# Patient Record
Sex: Female | Born: 1940 | Race: Black or African American | Hispanic: No | Marital: Single | Smoking: Never smoker
Health system: Southern US, Community
[De-identification: ages and names within clinical notes are randomized; demographics above are authoritative.]

## PROBLEM LIST (undated history)

## (undated) DIAGNOSIS — E119 Type 2 diabetes mellitus without complications: Secondary | ICD-10-CM

## (undated) DIAGNOSIS — I10 Essential (primary) hypertension: Secondary | ICD-10-CM

## (undated) DIAGNOSIS — E785 Hyperlipidemia, unspecified: Secondary | ICD-10-CM

## (undated) HISTORY — DX: Essential (primary) hypertension: I10

## (undated) HISTORY — DX: Hyperlipidemia, unspecified: E78.5

## (undated) HISTORY — DX: Type 2 diabetes mellitus without complications: E11.9

## (undated) HISTORY — PX: NO PAST SURGERIES: SHX2092

---

## 2001-06-27 ENCOUNTER — Emergency Department (HOSPITAL_COMMUNITY): Admission: EM | Admit: 2001-06-27 | Discharge: 2001-06-27 | Payer: Self-pay

## 2004-07-17 ENCOUNTER — Emergency Department (HOSPITAL_COMMUNITY): Admission: EM | Admit: 2004-07-17 | Discharge: 2004-07-17 | Payer: Self-pay | Admitting: Emergency Medicine

## 2004-09-06 ENCOUNTER — Emergency Department (HOSPITAL_COMMUNITY): Admission: EM | Admit: 2004-09-06 | Discharge: 2004-09-06 | Payer: Self-pay | Admitting: Family Medicine

## 2004-10-03 ENCOUNTER — Emergency Department (HOSPITAL_COMMUNITY): Admission: EM | Admit: 2004-10-03 | Discharge: 2004-10-03 | Payer: Self-pay | Admitting: Family Medicine

## 2004-11-02 ENCOUNTER — Emergency Department (HOSPITAL_COMMUNITY): Admission: EM | Admit: 2004-11-02 | Discharge: 2004-11-02 | Payer: Self-pay | Admitting: Family Medicine

## 2004-11-09 ENCOUNTER — Emergency Department (HOSPITAL_COMMUNITY): Admission: EM | Admit: 2004-11-09 | Discharge: 2004-11-09 | Payer: Self-pay | Admitting: Family Medicine

## 2011-11-23 DIAGNOSIS — E785 Hyperlipidemia, unspecified: Secondary | ICD-10-CM | POA: Diagnosis not present

## 2011-11-23 DIAGNOSIS — H524 Presbyopia: Secondary | ICD-10-CM | POA: Diagnosis not present

## 2011-11-23 DIAGNOSIS — I10 Essential (primary) hypertension: Secondary | ICD-10-CM | POA: Diagnosis not present

## 2011-11-23 DIAGNOSIS — H52229 Regular astigmatism, unspecified eye: Secondary | ICD-10-CM | POA: Diagnosis not present

## 2011-11-23 DIAGNOSIS — E119 Type 2 diabetes mellitus without complications: Secondary | ICD-10-CM | POA: Diagnosis not present

## 2011-11-23 DIAGNOSIS — H5200 Hypermetropia, unspecified eye: Secondary | ICD-10-CM | POA: Diagnosis not present

## 2011-11-23 DIAGNOSIS — J019 Acute sinusitis, unspecified: Secondary | ICD-10-CM | POA: Diagnosis not present

## 2012-03-04 DIAGNOSIS — J029 Acute pharyngitis, unspecified: Secondary | ICD-10-CM | POA: Diagnosis not present

## 2012-03-04 DIAGNOSIS — J019 Acute sinusitis, unspecified: Secondary | ICD-10-CM | POA: Diagnosis not present

## 2012-04-07 DIAGNOSIS — E119 Type 2 diabetes mellitus without complications: Secondary | ICD-10-CM | POA: Diagnosis not present

## 2012-04-07 DIAGNOSIS — I10 Essential (primary) hypertension: Secondary | ICD-10-CM | POA: Diagnosis not present

## 2012-07-10 DIAGNOSIS — E119 Type 2 diabetes mellitus without complications: Secondary | ICD-10-CM | POA: Diagnosis not present

## 2012-07-10 DIAGNOSIS — I10 Essential (primary) hypertension: Secondary | ICD-10-CM | POA: Diagnosis not present

## 2012-07-10 DIAGNOSIS — E785 Hyperlipidemia, unspecified: Secondary | ICD-10-CM | POA: Diagnosis not present

## 2012-07-16 DIAGNOSIS — J019 Acute sinusitis, unspecified: Secondary | ICD-10-CM | POA: Diagnosis not present

## 2012-10-21 DIAGNOSIS — E669 Obesity, unspecified: Secondary | ICD-10-CM | POA: Diagnosis not present

## 2012-10-21 DIAGNOSIS — E119 Type 2 diabetes mellitus without complications: Secondary | ICD-10-CM | POA: Diagnosis not present

## 2012-10-21 DIAGNOSIS — E785 Hyperlipidemia, unspecified: Secondary | ICD-10-CM | POA: Diagnosis not present

## 2012-10-21 DIAGNOSIS — I10 Essential (primary) hypertension: Secondary | ICD-10-CM | POA: Diagnosis not present

## 2012-10-23 ENCOUNTER — Other Ambulatory Visit: Payer: Self-pay | Admitting: Nurse Practitioner

## 2012-10-23 ENCOUNTER — Other Ambulatory Visit (HOSPITAL_COMMUNITY): Payer: Self-pay | Admitting: Nurse Practitioner

## 2012-10-23 DIAGNOSIS — M810 Age-related osteoporosis without current pathological fracture: Secondary | ICD-10-CM

## 2012-10-23 DIAGNOSIS — Z1231 Encounter for screening mammogram for malignant neoplasm of breast: Secondary | ICD-10-CM

## 2012-11-14 ENCOUNTER — Ambulatory Visit (HOSPITAL_COMMUNITY)
Admission: RE | Admit: 2012-11-14 | Discharge: 2012-11-14 | Disposition: A | Discharge status: Home / Self Care | Payer: BC Managed Care – PPO | Source: Ambulatory Visit | Attending: Nurse Practitioner | Admitting: Nurse Practitioner

## 2012-11-14 ENCOUNTER — Ambulatory Visit: Payer: Self-pay

## 2012-11-14 DIAGNOSIS — J209 Acute bronchitis, unspecified: Secondary | ICD-10-CM | POA: Diagnosis not present

## 2012-11-14 DIAGNOSIS — Z13820 Encounter for screening for osteoporosis: Secondary | ICD-10-CM | POA: Diagnosis not present

## 2012-11-14 DIAGNOSIS — J019 Acute sinusitis, unspecified: Secondary | ICD-10-CM | POA: Diagnosis not present

## 2012-11-14 DIAGNOSIS — Z1231 Encounter for screening mammogram for malignant neoplasm of breast: Secondary | ICD-10-CM

## 2012-11-14 DIAGNOSIS — Z780 Asymptomatic menopausal state: Secondary | ICD-10-CM | POA: Insufficient documentation

## 2012-11-14 DIAGNOSIS — M810 Age-related osteoporosis without current pathological fracture: Secondary | ICD-10-CM | POA: Diagnosis not present

## 2013-01-19 DIAGNOSIS — J040 Acute laryngitis: Secondary | ICD-10-CM | POA: Diagnosis not present

## 2013-01-20 ENCOUNTER — Encounter: Payer: Self-pay | Admitting: Gastroenterology

## 2013-02-05 DIAGNOSIS — I10 Essential (primary) hypertension: Secondary | ICD-10-CM | POA: Diagnosis not present

## 2013-02-05 DIAGNOSIS — E119 Type 2 diabetes mellitus without complications: Secondary | ICD-10-CM | POA: Diagnosis not present

## 2013-02-05 DIAGNOSIS — E785 Hyperlipidemia, unspecified: Secondary | ICD-10-CM | POA: Diagnosis not present

## 2013-02-16 ENCOUNTER — Ambulatory Visit: Payer: Non-veteran care | Admitting: Gastroenterology

## 2013-05-18 DIAGNOSIS — E785 Hyperlipidemia, unspecified: Secondary | ICD-10-CM | POA: Diagnosis not present

## 2013-05-18 DIAGNOSIS — E669 Obesity, unspecified: Secondary | ICD-10-CM | POA: Diagnosis not present

## 2013-05-18 DIAGNOSIS — E119 Type 2 diabetes mellitus without complications: Secondary | ICD-10-CM | POA: Diagnosis not present

## 2013-05-18 DIAGNOSIS — I10 Essential (primary) hypertension: Secondary | ICD-10-CM | POA: Diagnosis not present

## 2013-08-15 DIAGNOSIS — Z23 Encounter for immunization: Secondary | ICD-10-CM | POA: Diagnosis not present

## 2013-09-01 DIAGNOSIS — I10 Essential (primary) hypertension: Secondary | ICD-10-CM | POA: Diagnosis not present

## 2013-09-01 DIAGNOSIS — E785 Hyperlipidemia, unspecified: Secondary | ICD-10-CM | POA: Diagnosis not present

## 2013-09-01 DIAGNOSIS — E119 Type 2 diabetes mellitus without complications: Secondary | ICD-10-CM | POA: Diagnosis not present

## 2013-09-01 DIAGNOSIS — E669 Obesity, unspecified: Secondary | ICD-10-CM | POA: Diagnosis not present

## 2013-10-07 DIAGNOSIS — J329 Chronic sinusitis, unspecified: Secondary | ICD-10-CM | POA: Diagnosis not present

## 2013-10-07 DIAGNOSIS — J40 Bronchitis, not specified as acute or chronic: Secondary | ICD-10-CM | POA: Diagnosis not present

## 2013-11-25 DIAGNOSIS — D649 Anemia, unspecified: Secondary | ICD-10-CM | POA: Diagnosis not present

## 2013-11-25 DIAGNOSIS — J40 Bronchitis, not specified as acute or chronic: Secondary | ICD-10-CM | POA: Diagnosis not present

## 2013-11-30 DIAGNOSIS — M999 Biomechanical lesion, unspecified: Secondary | ICD-10-CM | POA: Diagnosis not present

## 2013-11-30 DIAGNOSIS — IMO0002 Reserved for concepts with insufficient information to code with codable children: Secondary | ICD-10-CM | POA: Diagnosis not present

## 2013-12-07 DIAGNOSIS — J180 Bronchopneumonia, unspecified organism: Secondary | ICD-10-CM | POA: Diagnosis not present

## 2014-01-05 ENCOUNTER — Telehealth: Payer: Self-pay | Admitting: Pulmonary Disease

## 2014-01-05 ENCOUNTER — Encounter: Payer: Self-pay | Admitting: Pulmonary Disease

## 2014-01-05 ENCOUNTER — Ambulatory Visit (INDEPENDENT_AMBULATORY_CARE_PROVIDER_SITE_OTHER)
Admission: RE | Admit: 2014-01-05 | Discharge: 2014-01-05 | Disposition: A | Discharge status: Home / Self Care | Payer: BC Managed Care – PPO | Source: Ambulatory Visit | Attending: Pulmonary Disease | Admitting: Pulmonary Disease

## 2014-01-05 ENCOUNTER — Ambulatory Visit (INDEPENDENT_AMBULATORY_CARE_PROVIDER_SITE_OTHER): Payer: BC Managed Care – PPO | Admitting: Pulmonary Disease

## 2014-01-05 VITALS — BP 140/82 | HR 69 | Temp 97.4°F | Ht 61.5 in | Wt 168.6 lb

## 2014-01-05 DIAGNOSIS — R059 Cough, unspecified: Secondary | ICD-10-CM | POA: Diagnosis not present

## 2014-01-05 DIAGNOSIS — R05 Cough: Secondary | ICD-10-CM

## 2014-01-05 NOTE — Assessment & Plan Note (Signed)
The patient has a history of recurrent URI/bronchitis, but it is unclear how much of this is related to her upper airway rather than lower airway. She has chronic hoarseness and throat clearing, and also describes classic upper airway pseudo wheezing.  This may be all related to her ACE inhibitor, and I have suggested that she comes off of this medication as quickly as possible. She also works around fifth graders, which puts her at risk for recurrent viral infections as well. She has never smoked, and has no history of asthma. At this point, I would like to check a chest x-ray to make sure she does not have underlying pulmonary pathology, and will also do pulmonary function studies to see if she has obstructive airways disease. If all of these are unremarkable, I would like to see how she does off the ACE inhibitor, and treating any postnasal drip. The main goal at this time is to determine whether she has any underlying lung disease that may be contributing to her symptoms.

## 2014-01-05 NOTE — Progress Notes (Signed)
   Subjective:    Patient ID: Jaclyn Chandler, female    DOB: [birth year], 73 y.o.   MRN: [id number]  HPI The patient is a 73 year old female who I've been asked to see for recurrent episodes of "bronchitis". The patient states that she will get sick about 2-3 times a year, and this is usually characterized by cough with discolored mucus, some chest congestion, as well as occasional shortness of breath. She will typically be treated with an antibiotic, and will return to baseline quickly. In between episodes, she has no pulmonary symptoms. She has no history of asthma, and has never had pulmonary function studies. She has not had a recent chest x-ray. 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 She also has chronic hoarseness, and describes upper airway wheezing. She is clearing her throat frequently during our visit today. She denies any recurrent sinusitis, but does have postnasal drip. She only has rare reflux symptoms. It should be noted that she is on an ACE inhibitor.   Review of Systems  Constitutional: Negative for fever and unexpected weight change.  HENT: Positive for postnasal drip, sneezing and voice change. Negative for congestion, dental problem, ear pain, nosebleeds, rhinorrhea, sinus pressure, sore throat and trouble swallowing.   Eyes: Negative for redness and itching.  Respiratory: Positive for cough. Negative for chest tightness, shortness of breath and wheezing.   Cardiovascular: Negative for palpitations and leg swelling.  Gastrointestinal: Negative for nausea and vomiting.  Genitourinary: Negative for dysuria.  Musculoskeletal: Negative for joint swelling.  Skin: Negative for rash.  Neurological: Negative for headaches.  Hematological: Does not bruise/bleed easily.  Psychiatric/Behavioral: Negative for dysphoric mood. The patient is not nervous/anxious.        Objective:   Physical Exam Constitutional:  Well  developed, no acute distress  HENT:  Nares patent without discharge  Oropharynx without exudate, palate and uvula are normal  Eyes:  Perrla, eomi, no scleral icterus  Neck:  No JVD, no TMG  Cardiovascular:  Normal rate, regular rhythm, no rubs or gallops.  No murmurs        Intact distal pulses  Pulmonary :  Normal breath sounds, no stridor or respiratory distress   No rales, rhonchi, or wheezing.  +hoarseness with mild upper airway noise.   Abdominal:  Soft, nondistended, bowel sounds present.  No tenderness noted.   Musculoskeletal:  mild lower extremity edema noted.  Lymph Nodes:  No cervical lymphadenopathy noted  Skin:  No cyanosis noted  Neurologic:  Alert, appropriate, moves all 4 extremities without obvious deficit.         Assessment & Plan:

## 2014-01-05 NOTE — Patient Instructions (Addendum)
Please talk with your primary about coming off lisinopril ASAP. Take chlorpheniramine 4mg  OTC, one every night at bedtime if you are having issues with postnasal drip. Will check a chest xray today, and will call you with results.  Will schedule for breathing tests in next 2-3 weeks to evaluate for possible asthma, and will see you back the same day to discuss.  Hopefully, you will be off the lisinopril for a period of time prior to this.

## 2014-01-05 NOTE — Telephone Encounter (Signed)
lmomtcb x1 for pt 

## 2014-01-06 NOTE — Telephone Encounter (Signed)
Result Notes    Notes Recorded by Barbaraann ShareKeith M Clance, MD on 01/05/2014 at 5:47 PM Please let pt know that her cxr is normal   I spoke with patient about results and she verbalized understanding and had no questions

## 2014-01-21 ENCOUNTER — Telehealth: Payer: Self-pay | Admitting: Pulmonary Disease

## 2014-01-21 NOTE — Telephone Encounter (Signed)
Pt is aware that the medication [facility] wanted her to take is OTC. She states that she will go to the drugstore tonight to pick this up.

## 2014-02-09 ENCOUNTER — Ambulatory Visit: Payer: BC Managed Care – PPO | Admitting: Pulmonary Disease

## 2014-02-16 DIAGNOSIS — I10 Essential (primary) hypertension: Secondary | ICD-10-CM | POA: Diagnosis not present

## 2014-02-16 DIAGNOSIS — E119 Type 2 diabetes mellitus without complications: Secondary | ICD-10-CM | POA: Diagnosis not present

## 2014-02-16 DIAGNOSIS — E785 Hyperlipidemia, unspecified: Secondary | ICD-10-CM | POA: Diagnosis not present

## 2014-03-17 ENCOUNTER — Ambulatory Visit: Payer: BC Managed Care – PPO | Admitting: Pulmonary Disease

## 2014-05-10 DIAGNOSIS — E785 Hyperlipidemia, unspecified: Secondary | ICD-10-CM | POA: Diagnosis not present

## 2014-05-10 DIAGNOSIS — H2510 Age-related nuclear cataract, unspecified eye: Secondary | ICD-10-CM | POA: Diagnosis not present

## 2014-05-10 DIAGNOSIS — E119 Type 2 diabetes mellitus without complications: Secondary | ICD-10-CM | POA: Diagnosis not present

## 2014-05-10 DIAGNOSIS — Z6831 Body mass index (BMI) 31.0-31.9, adult: Secondary | ICD-10-CM | POA: Diagnosis not present

## 2014-05-10 DIAGNOSIS — I10 Essential (primary) hypertension: Secondary | ICD-10-CM | POA: Diagnosis not present

## 2014-09-01 DIAGNOSIS — Z23 Encounter for immunization: Secondary | ICD-10-CM | POA: Diagnosis not present

## 2014-09-15 DIAGNOSIS — I10 Essential (primary) hypertension: Secondary | ICD-10-CM | POA: Diagnosis not present

## 2014-09-15 DIAGNOSIS — E1129 Type 2 diabetes mellitus with other diabetic kidney complication: Secondary | ICD-10-CM | POA: Diagnosis not present

## 2014-09-15 DIAGNOSIS — E785 Hyperlipidemia, unspecified: Secondary | ICD-10-CM | POA: Diagnosis not present

## 2014-09-15 DIAGNOSIS — Z6833 Body mass index (BMI) 33.0-33.9, adult: Secondary | ICD-10-CM | POA: Diagnosis not present

## 2014-09-15 DIAGNOSIS — N183 Chronic kidney disease, stage 3 (moderate): Secondary | ICD-10-CM | POA: Diagnosis not present

## 2014-10-20 DIAGNOSIS — I809 Phlebitis and thrombophlebitis of unspecified site: Secondary | ICD-10-CM | POA: Diagnosis not present

## 2014-10-20 DIAGNOSIS — Z6833 Body mass index (BMI) 33.0-33.9, adult: Secondary | ICD-10-CM | POA: Diagnosis not present

## 2015-01-20 ENCOUNTER — Other Ambulatory Visit (HOSPITAL_COMMUNITY): Payer: Self-pay | Admitting: Legal Medicine

## 2015-01-20 ENCOUNTER — Other Ambulatory Visit: Payer: Self-pay

## 2015-01-20 DIAGNOSIS — I10 Essential (primary) hypertension: Secondary | ICD-10-CM | POA: Diagnosis not present

## 2015-01-20 DIAGNOSIS — M810 Age-related osteoporosis without current pathological fracture: Secondary | ICD-10-CM | POA: Diagnosis not present

## 2015-01-20 DIAGNOSIS — E785 Hyperlipidemia, unspecified: Secondary | ICD-10-CM | POA: Diagnosis not present

## 2015-01-20 DIAGNOSIS — Z1231 Encounter for screening mammogram for malignant neoplasm of breast: Secondary | ICD-10-CM

## 2015-01-20 DIAGNOSIS — Z6833 Body mass index (BMI) 33.0-33.9, adult: Secondary | ICD-10-CM | POA: Diagnosis not present

## 2015-01-20 DIAGNOSIS — E119 Type 2 diabetes mellitus without complications: Secondary | ICD-10-CM | POA: Diagnosis not present

## 2015-01-21 ENCOUNTER — Ambulatory Visit: Payer: Non-veteran care

## 2015-01-21 ENCOUNTER — Other Ambulatory Visit (HOSPITAL_COMMUNITY): Payer: Self-pay | Admitting: Legal Medicine

## 2015-01-21 ENCOUNTER — Encounter (INDEPENDENT_AMBULATORY_CARE_PROVIDER_SITE_OTHER): Payer: Self-pay

## 2015-01-21 ENCOUNTER — Ambulatory Visit (HOSPITAL_COMMUNITY)
Admission: RE | Admit: 2015-01-21 | Discharge: 2015-01-21 | Disposition: A | Discharge status: Home / Self Care | Payer: BC Managed Care – PPO | Source: Ambulatory Visit | Attending: Legal Medicine | Admitting: Legal Medicine

## 2015-01-21 DIAGNOSIS — Z13820 Encounter for screening for osteoporosis: Secondary | ICD-10-CM

## 2015-01-21 DIAGNOSIS — Z1231 Encounter for screening mammogram for malignant neoplasm of breast: Secondary | ICD-10-CM | POA: Diagnosis not present

## 2015-01-21 DIAGNOSIS — M8580 Other specified disorders of bone density and structure, unspecified site: Secondary | ICD-10-CM

## 2015-01-21 DIAGNOSIS — Z780 Asymptomatic menopausal state: Secondary | ICD-10-CM | POA: Diagnosis not present

## 2015-01-21 DIAGNOSIS — M810 Age-related osteoporosis without current pathological fracture: Secondary | ICD-10-CM | POA: Diagnosis not present

## 2015-01-24 DIAGNOSIS — Z23 Encounter for immunization: Secondary | ICD-10-CM | POA: Diagnosis not present

## 2015-07-27 DIAGNOSIS — Z6835 Body mass index (BMI) 35.0-35.9, adult: Secondary | ICD-10-CM | POA: Diagnosis not present

## 2015-07-27 DIAGNOSIS — I10 Essential (primary) hypertension: Secondary | ICD-10-CM | POA: Diagnosis not present

## 2015-07-27 DIAGNOSIS — E119 Type 2 diabetes mellitus without complications: Secondary | ICD-10-CM | POA: Diagnosis not present

## 2016-01-25 DIAGNOSIS — I10 Essential (primary) hypertension: Secondary | ICD-10-CM | POA: Diagnosis not present

## 2016-01-25 DIAGNOSIS — Z6835 Body mass index (BMI) 35.0-35.9, adult: Secondary | ICD-10-CM | POA: Diagnosis not present

## 2016-01-25 DIAGNOSIS — E1129 Type 2 diabetes mellitus with other diabetic kidney complication: Secondary | ICD-10-CM | POA: Diagnosis not present

## 2016-01-25 DIAGNOSIS — N183 Chronic kidney disease, stage 3 (moderate): Secondary | ICD-10-CM | POA: Diagnosis not present

## 2016-01-25 DIAGNOSIS — E785 Hyperlipidemia, unspecified: Secondary | ICD-10-CM | POA: Diagnosis not present

## 2016-02-25 DIAGNOSIS — J019 Acute sinusitis, unspecified: Secondary | ICD-10-CM | POA: Diagnosis not present

## 2016-02-25 DIAGNOSIS — Z6834 Body mass index (BMI) 34.0-34.9, adult: Secondary | ICD-10-CM | POA: Diagnosis not present

## 2016-09-12 DIAGNOSIS — H25811 Combined forms of age-related cataract, right eye: Secondary | ICD-10-CM | POA: Diagnosis not present

## 2016-09-12 DIAGNOSIS — H25812 Combined forms of age-related cataract, left eye: Secondary | ICD-10-CM | POA: Diagnosis not present

## 2016-10-11 DIAGNOSIS — H2512 Age-related nuclear cataract, left eye: Secondary | ICD-10-CM | POA: Diagnosis not present

## 2016-10-11 DIAGNOSIS — H25812 Combined forms of age-related cataract, left eye: Secondary | ICD-10-CM | POA: Diagnosis not present

## 2017-01-03 DIAGNOSIS — I10 Essential (primary) hypertension: Secondary | ICD-10-CM | POA: Diagnosis not present

## 2017-01-03 DIAGNOSIS — R0981 Nasal congestion: Secondary | ICD-10-CM | POA: Diagnosis not present

## 2017-01-03 DIAGNOSIS — R05 Cough: Secondary | ICD-10-CM | POA: Diagnosis not present

## 2017-01-03 DIAGNOSIS — J019 Acute sinusitis, unspecified: Secondary | ICD-10-CM | POA: Diagnosis not present

## 2017-02-02 DIAGNOSIS — Z6833 Body mass index (BMI) 33.0-33.9, adult: Secondary | ICD-10-CM | POA: Diagnosis not present

## 2017-02-02 DIAGNOSIS — R21 Rash and other nonspecific skin eruption: Secondary | ICD-10-CM | POA: Diagnosis not present

## 2017-10-16 ENCOUNTER — Other Ambulatory Visit: Payer: Self-pay | Admitting: Nurse Practitioner

## 2017-10-16 DIAGNOSIS — Z1231 Encounter for screening mammogram for malignant neoplasm of breast: Secondary | ICD-10-CM

## 2017-10-18 ENCOUNTER — Ambulatory Visit
Admission: RE | Admit: 2017-10-18 | Discharge: 2017-10-18 | Disposition: A | Discharge status: Home / Self Care | Payer: Medicare Other | Source: Ambulatory Visit | Attending: Nurse Practitioner | Admitting: Nurse Practitioner

## 2017-10-18 ENCOUNTER — Ambulatory Visit: Payer: Non-veteran care

## 2017-10-18 DIAGNOSIS — Z1231 Encounter for screening mammogram for malignant neoplasm of breast: Secondary | ICD-10-CM

## 2017-11-05 ENCOUNTER — Other Ambulatory Visit: Payer: Self-pay | Admitting: Nurse Practitioner

## 2017-11-05 DIAGNOSIS — M810 Age-related osteoporosis without current pathological fracture: Secondary | ICD-10-CM

## 2018-01-10 ENCOUNTER — Ambulatory Visit
Admission: RE | Admit: 2018-01-10 | Discharge: 2018-01-10 | Disposition: A | Discharge status: Home / Self Care | Payer: Medicare Other | Source: Ambulatory Visit | Attending: Nurse Practitioner | Admitting: Nurse Practitioner

## 2018-01-10 DIAGNOSIS — M810 Age-related osteoporosis without current pathological fracture: Secondary | ICD-10-CM

## 2019-06-04 ENCOUNTER — Other Ambulatory Visit: Payer: Self-pay | Admitting: Nurse Practitioner

## 2019-06-04 DIAGNOSIS — Z1231 Encounter for screening mammogram for malignant neoplasm of breast: Secondary | ICD-10-CM

## 2019-07-21 ENCOUNTER — Ambulatory Visit
Admission: RE | Admit: 2019-07-21 | Discharge: 2019-07-21 | Disposition: A | Discharge status: Home / Self Care | Payer: Medicare PPO | Source: Ambulatory Visit | Attending: Nurse Practitioner | Admitting: Nurse Practitioner

## 2019-07-21 ENCOUNTER — Other Ambulatory Visit: Payer: Self-pay

## 2019-07-21 DIAGNOSIS — Z1231 Encounter for screening mammogram for malignant neoplasm of breast: Secondary | ICD-10-CM

## 2019-11-11 ENCOUNTER — Other Ambulatory Visit: Payer: Self-pay | Admitting: Nurse Practitioner

## 2019-11-11 DIAGNOSIS — M8589 Other specified disorders of bone density and structure, multiple sites: Secondary | ICD-10-CM

## 2020-01-05 ENCOUNTER — Encounter: Payer: Self-pay | Admitting: Gastroenterology

## 2020-01-12 ENCOUNTER — Other Ambulatory Visit: Payer: Medicare PPO

## 2020-01-21 ENCOUNTER — Telehealth: Payer: Self-pay | Admitting: *Deleted

## 2020-01-21 NOTE — Telephone Encounter (Signed)
noted 

## 2020-01-21 NOTE — Telephone Encounter (Signed)
Dr. Adela Lank,  This pt is coming in for a PV on 01-28-20 and her colonoscopy is 02-10-20.  From what I can tell, she has never had a colonoscopy before and she is 7.  Per our PV protocol, we are to check with the MD prior to their procedure if they are greater than 77 years old and it a direct/screening colonoscopy to see if you want to proceed.  Please advise.  Thanks, [employer]

## 2020-01-21 NOTE — Telephone Encounter (Signed)
Thanks [employer]. If she has never had a prior colonoscopy guidelines recommend a first time exam at this age and if she is otherwise without any significant comorbidities that would warrant her case being done at the hospital, I think okay to proceed with direct book colonoscopy at the [facility]. Thanks

## 2020-02-10 ENCOUNTER — Encounter: Payer: Medicare PPO | Admitting: Gastroenterology

## 2020-06-21 ENCOUNTER — Other Ambulatory Visit: Payer: Self-pay | Admitting: Nurse Practitioner

## 2020-06-21 DIAGNOSIS — Z1231 Encounter for screening mammogram for malignant neoplasm of breast: Secondary | ICD-10-CM

## 2020-07-25 ENCOUNTER — Ambulatory Visit: Payer: Medicare Other

## 2020-08-15 ENCOUNTER — Other Ambulatory Visit: Payer: Self-pay

## 2020-08-15 ENCOUNTER — Ambulatory Visit
Admission: RE | Admit: 2020-08-15 | Discharge: 2020-08-15 | Disposition: A | Discharge status: Home / Self Care | Payer: Medicare Other | Source: Ambulatory Visit | Attending: Nurse Practitioner | Admitting: Nurse Practitioner

## 2020-08-15 DIAGNOSIS — Z1231 Encounter for screening mammogram for malignant neoplasm of breast: Secondary | ICD-10-CM

## 2021-02-03 ENCOUNTER — Other Ambulatory Visit: Payer: Self-pay | Admitting: Physician Assistant

## 2021-02-03 DIAGNOSIS — M810 Age-related osteoporosis without current pathological fracture: Secondary | ICD-10-CM

## 2021-08-18 ENCOUNTER — Encounter: Payer: Self-pay | Admitting: Physician Assistant

## 2021-09-12 ENCOUNTER — Other Ambulatory Visit: Payer: Self-pay | Admitting: *Deleted

## 2021-09-12 ENCOUNTER — Other Ambulatory Visit: Payer: Self-pay | Admitting: Cardiology

## 2021-09-12 DIAGNOSIS — Z1231 Encounter for screening mammogram for malignant neoplasm of breast: Secondary | ICD-10-CM

## 2021-09-13 ENCOUNTER — Ambulatory Visit: Payer: Medicare Other | Admitting: Physician Assistant

## 2021-10-18 ENCOUNTER — Ambulatory Visit
Admission: RE | Admit: 2021-10-18 | Discharge: 2021-10-18 | Disposition: A | Discharge status: Home / Self Care | Payer: Medicare HMO | Source: Ambulatory Visit | Attending: Physician Assistant | Admitting: Physician Assistant

## 2021-10-18 ENCOUNTER — Ambulatory Visit: Payer: Medicare HMO

## 2021-10-18 DIAGNOSIS — Z1231 Encounter for screening mammogram for malignant neoplasm of breast: Secondary | ICD-10-CM

## 2021-10-30 ENCOUNTER — Telehealth: Payer: Self-pay | Admitting: Gastroenterology

## 2021-10-30 ENCOUNTER — Ambulatory Visit: Payer: Medicare HMO | Admitting: Gastroenterology

## 2021-10-30 NOTE — Progress Notes (Deleted)
[facility] Consult Note:  History: Jaclyn Chandler 10/30/2021  Referring provider: Abigail Miyamoto, MD  Reason for consult/chief complaint: No chief complaint on file.   Subjective  HPI:  ***   ROS:  Review of Systems   Past Medical History: Past Medical History:  Diagnosis Date   Diabetes mellitus, type 2 (HCC)    HTN (hypertension)    Hyperlipidemia      Past Surgical History: Past Surgical History:  Procedure Laterality Date   NO PAST SURGERIES       Family History: Family History  Problem Relation Age of Onset   Heart disease Mother    Hypertension Mother    Rheum arthritis Paternal Aunt    Hypertension Sister        multiple   Lung disease Brother    Hypertension Brother        multiple   Breast cancer Neg Hx     Social History: Social History   Socioeconomic History   Marital status: Single    Spouse name: Not on file   Number of children: Not on file   Years of education: Not on file   Highest education level: Not on file  Occupational History   Occupation: Data processing manager    Employer: [employer]  Tobacco Use   Smoking status: Never   Smokeless tobacco: Not on file  Substance and Sexual Activity   Alcohol use: No   Drug use: No   Sexual activity: Not on file  Other Topics Concern   Not on file  Social History Narrative   Not on file   Social Determinants of Health   Financial Resource Strain: Not on file  Food Insecurity: Not on file  Transportation Needs: Not on file  Physical Activity: Not on file  Stress: Not on file  Social Connections: Not on file    Allergies: Allergies  Allergen Reactions   Penicillins     Outpatient Meds: Current Outpatient Medications  Medication Sig Dispense Refill   albuterol (PROVENTIL HFA;VENTOLIN HFA) 108 (90 BASE) MCG/ACT inhaler Inhale 1-2 puffs into the lungs every 6 (six) hours as needed for wheezing or shortness of breath.      aspirin 81 MG tablet Take 81 mg by mouth daily.     atenolol (TENORMIN) 50 MG tablet Take 50 mg by mouth daily.     Calcium Carbonate-Vitamin D (SM CALCIUM 500/VITAMIN D3 PO) Take 1 capsule by mouth daily.     COD LIVER OIL PO Take 415 mg by mouth daily.     Dextromethorphan-Guaifenesin 20-400 MG TABS Take by mouth.     Dextromethorphan-Guaifenesin 20-400 MG/5ML SYRP Take by mouth as needed.     Garlic 1000 MG CAPS Take 1 capsule by mouth daily.     guaiFENesin (MUCINEX) 600 MG 12 hr tablet Take 600 mg by mouth as needed.     lisinopril-hydrochlorothiazide (PRINZIDE,ZESTORETIC) 20-12.5 MG per tablet Take 1 tablet by mouth 2 (two) times daily.     metFORMIN (GLUCOPHAGE) 500 MG tablet Take 500 mg by mouth 2 (two) times daily with a meal.     Multiple Vitamin (ONE-DAILY MULTI VITAMINS PO) Take 1 tablet by mouth daily.     Omega-3 Fatty Acids (FISH OIL) 1000 MG CAPS Take 1 capsule by mouth daily.     simvastatin (ZOCOR) 40 MG tablet Take 40 mg by mouth daily.     Vit C-Cholecalciferol-Rose Hip (VITAMIN C & D3/ROSE HIPS) [phone] MG-UNIT-MG CAPS Take 1  tablet by mouth daily.     No current facility-administered medications for this visit.      ___________________________________________________________________ Objective   Exam:  There were no vitals taken for this visit. Wt Readings from Last 3 Encounters:  01/05/14 168 lb 9.6 oz (76.5 kg)    General: ***  Eyes: sclera anicteric, no redness ENT: oral mucosa moist without lesions, no cervical or supraclavicular lymphadenopathy CV: RRR without murmur, S1/S2, no JVD, no peripheral edema Resp: clear to auscultation bilaterally, normal RR and effort noted GI: soft, *** tenderness, with active bowel sounds. No guarding or palpable organomegaly noted. Skin; warm and dry, no rash or jaundice noted Neuro: awake, alert and oriented x 3. Normal gross motor function and fluent speech  Labs:  ***  Radiologic  Studies:  ***  Assessment: No diagnosis found.  ***  Plan:  ***  Thank you for the courtesy of this consult.  Please call me with any questions or concerns.  Charlie Pitter III  CC: Referring provider noted above

## 2021-10-30 NOTE — Telephone Encounter (Signed)
Good Afternoon Dr. Myrtie Neither,  Patient called to cancel appointment with you today at 3:20 due to not feeling well.   Patient stated that she would call back when she is feeling better to reschedule.

## 2021-12-07 ENCOUNTER — Other Ambulatory Visit: Payer: Self-pay | Admitting: Physician Assistant

## 2021-12-07 DIAGNOSIS — M810 Age-related osteoporosis without current pathological fracture: Secondary | ICD-10-CM

## 2022-01-08 ENCOUNTER — Other Ambulatory Visit: Payer: Self-pay | Admitting: Legal Medicine

## 2022-01-08 ENCOUNTER — Other Ambulatory Visit: Payer: Self-pay | Admitting: Cardiology

## 2022-05-07 ENCOUNTER — Other Ambulatory Visit: Payer: BC Managed Care – PPO

## 2022-09-25 ENCOUNTER — Other Ambulatory Visit: Payer: Self-pay | Admitting: Physician Assistant

## 2022-09-25 DIAGNOSIS — M810 Age-related osteoporosis without current pathological fracture: Secondary | ICD-10-CM

## 2022-09-25 DIAGNOSIS — Z1231 Encounter for screening mammogram for malignant neoplasm of breast: Secondary | ICD-10-CM

## 2022-11-15 ENCOUNTER — Other Ambulatory Visit: Payer: BC Managed Care – PPO

## 2022-12-20 ENCOUNTER — Ambulatory Visit
Admission: RE | Admit: 2022-12-20 | Discharge: 2022-12-20 | Disposition: A | Discharge status: Home / Self Care | Payer: Medicare HMO | Source: Ambulatory Visit | Attending: Physician Assistant | Admitting: Physician Assistant

## 2022-12-20 ENCOUNTER — Other Ambulatory Visit: Payer: BC Managed Care – PPO

## 2022-12-20 DIAGNOSIS — Z1231 Encounter for screening mammogram for malignant neoplasm of breast: Secondary | ICD-10-CM

## 2023-02-06 ENCOUNTER — Ambulatory Visit
Admission: RE | Admit: 2023-02-06 | Discharge: 2023-02-06 | Disposition: A | Discharge status: Home / Self Care | Payer: Medicare HMO | Source: Ambulatory Visit | Attending: Physician Assistant | Admitting: Physician Assistant

## 2023-02-06 ENCOUNTER — Other Ambulatory Visit: Payer: Self-pay

## 2023-02-06 DIAGNOSIS — R7989 Other specified abnormal findings of blood chemistry: Secondary | ICD-10-CM

## 2023-05-23 ENCOUNTER — Other Ambulatory Visit: Payer: Self-pay | Admitting: Physician Assistant

## 2023-05-23 DIAGNOSIS — M810 Age-related osteoporosis without current pathological fracture: Secondary | ICD-10-CM

## 2023-05-28 ENCOUNTER — Other Ambulatory Visit: Payer: BC Managed Care – PPO

## 2023-05-29 DIAGNOSIS — H2511 Age-related nuclear cataract, right eye: Secondary | ICD-10-CM | POA: Diagnosis not present

## 2023-07-18 DIAGNOSIS — I10 Essential (primary) hypertension: Secondary | ICD-10-CM | POA: Diagnosis not present

## 2023-07-18 DIAGNOSIS — E1165 Type 2 diabetes mellitus with hyperglycemia: Secondary | ICD-10-CM | POA: Diagnosis not present

## 2023-07-22 DIAGNOSIS — Z23 Encounter for immunization: Secondary | ICD-10-CM | POA: Diagnosis not present

## 2023-08-14 IMAGING — MG MM DIGITAL SCREENING BILAT W/ TOMO AND CAD
8 series · 8 of 24 positions shown · non-contrast
Comparison: Previous exam(s).

CLINICAL DATA: Screening.

EXAM:
DIGITAL SCREENING BILATERAL MAMMOGRAM WITH TOMOSYNTHESIS AND CAD
TECHNIQUE: Bilateral screening digital craniocaudal and mediolateral oblique
mammograms were obtained. Bilateral screening digital breast
tomosynthesis was performed. The images were evaluated with
computer-aided detection.

[L CC synth-2D]
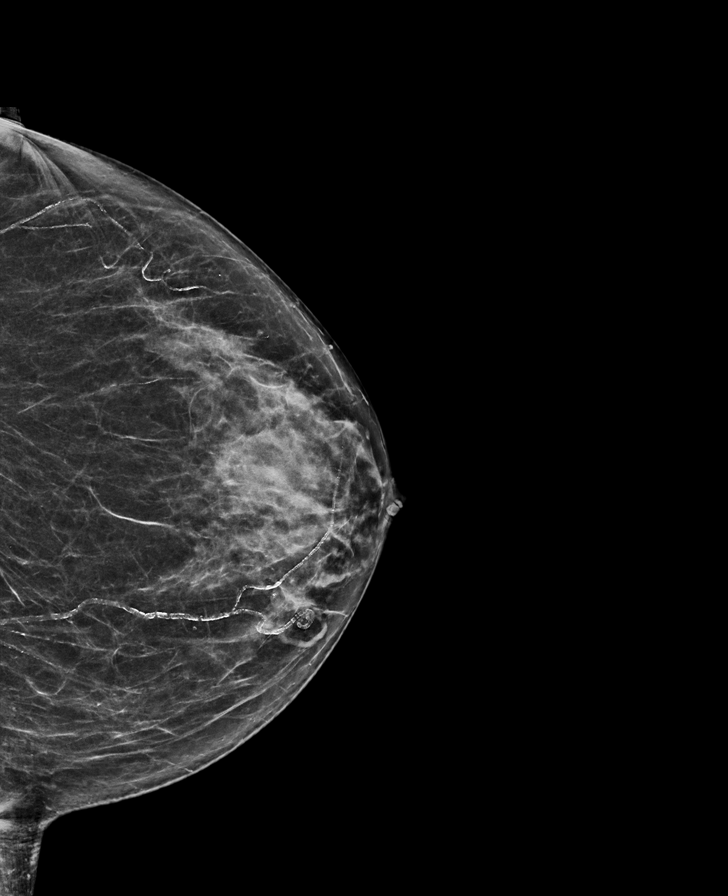

[L MLO synth-2D]
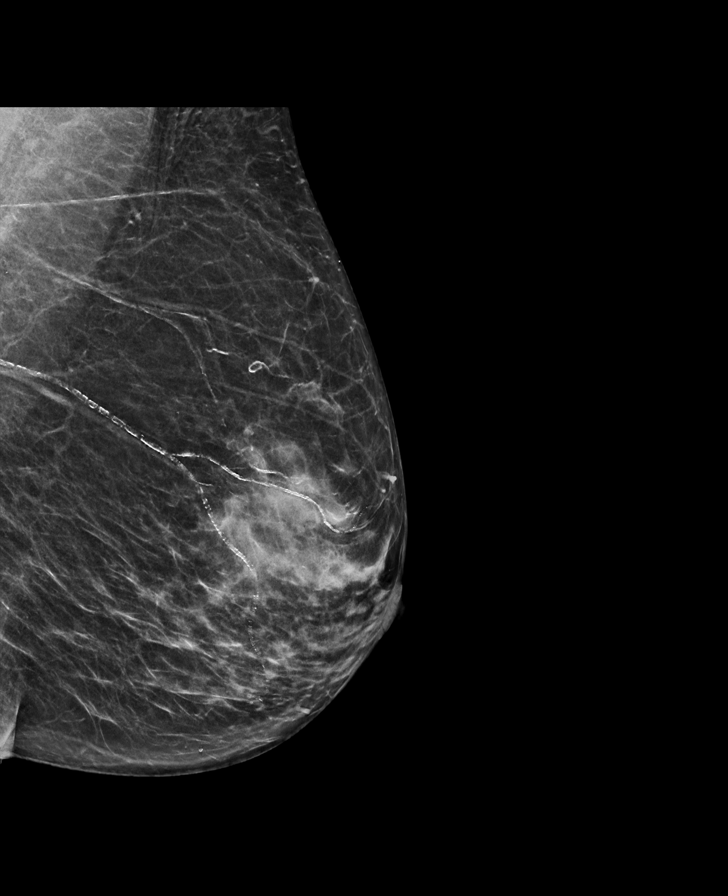

[R CC synth-2D]
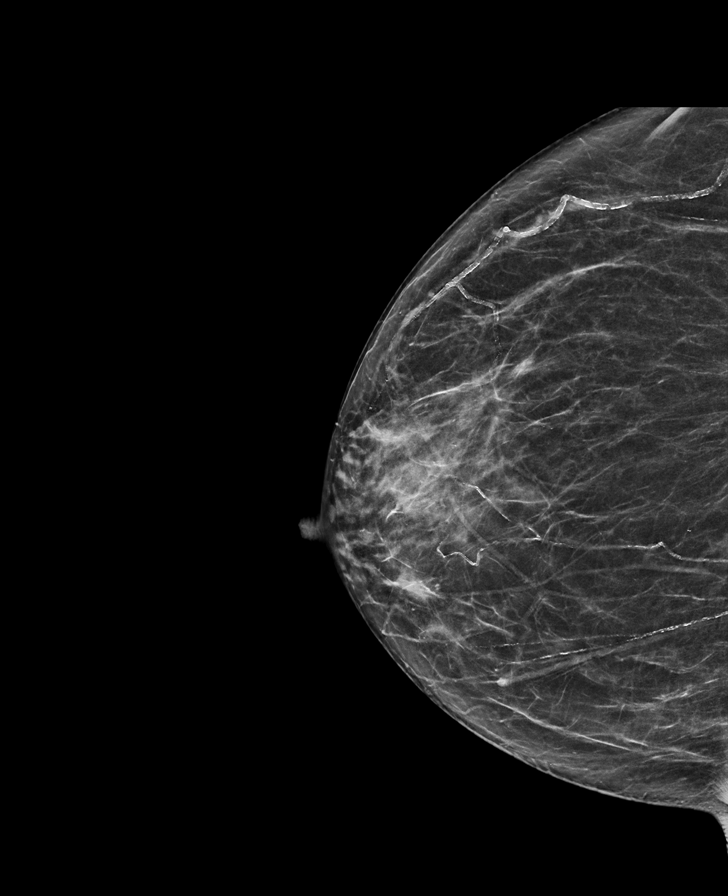

[R MLO synth-2D]
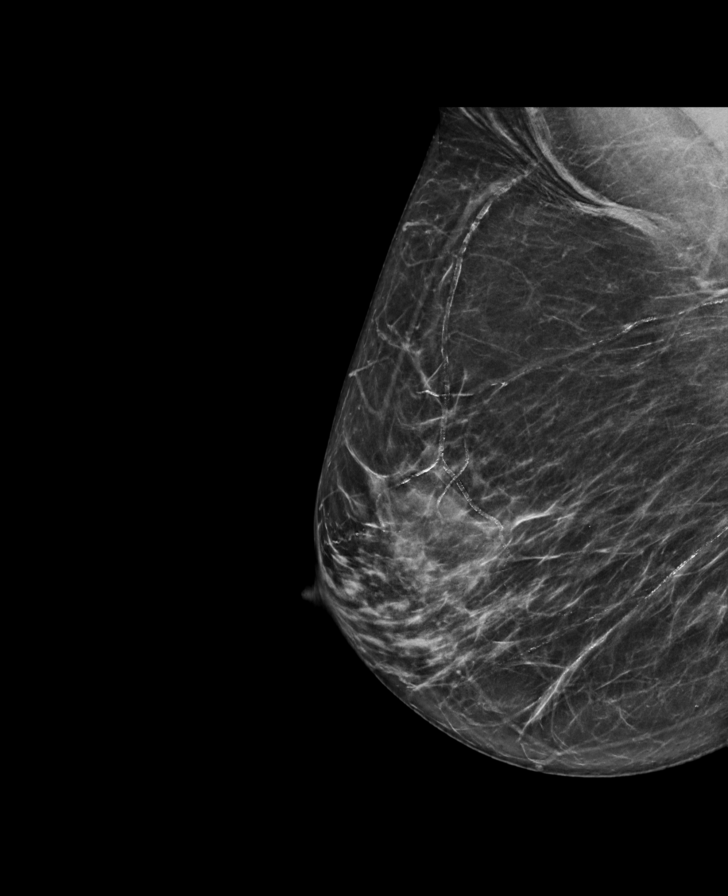

[R MLO tomo · tomo slice 35/70.0]
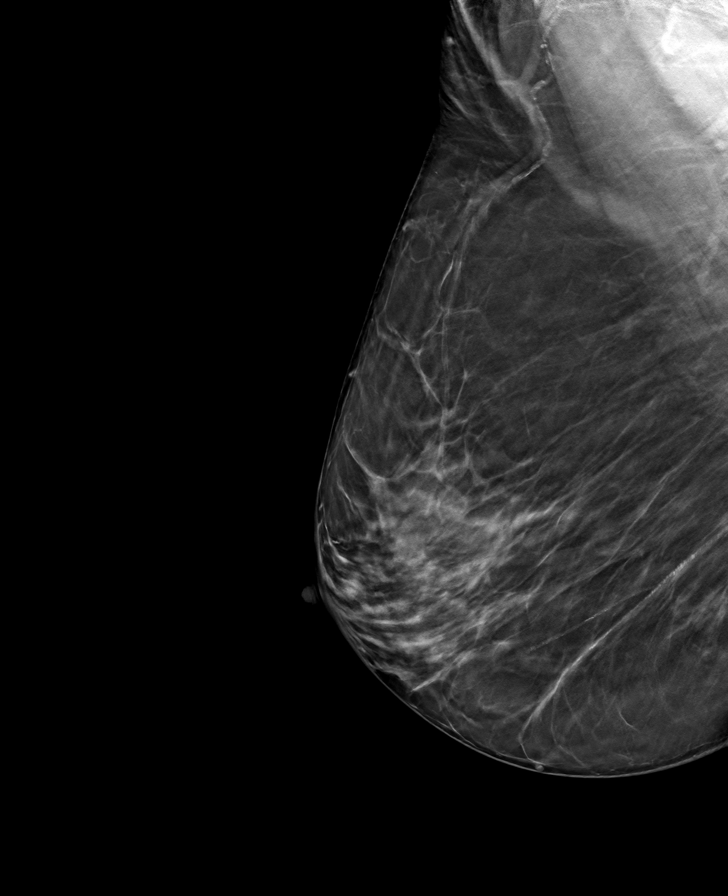

[R CC tomo · tomo slice 32/63.0]
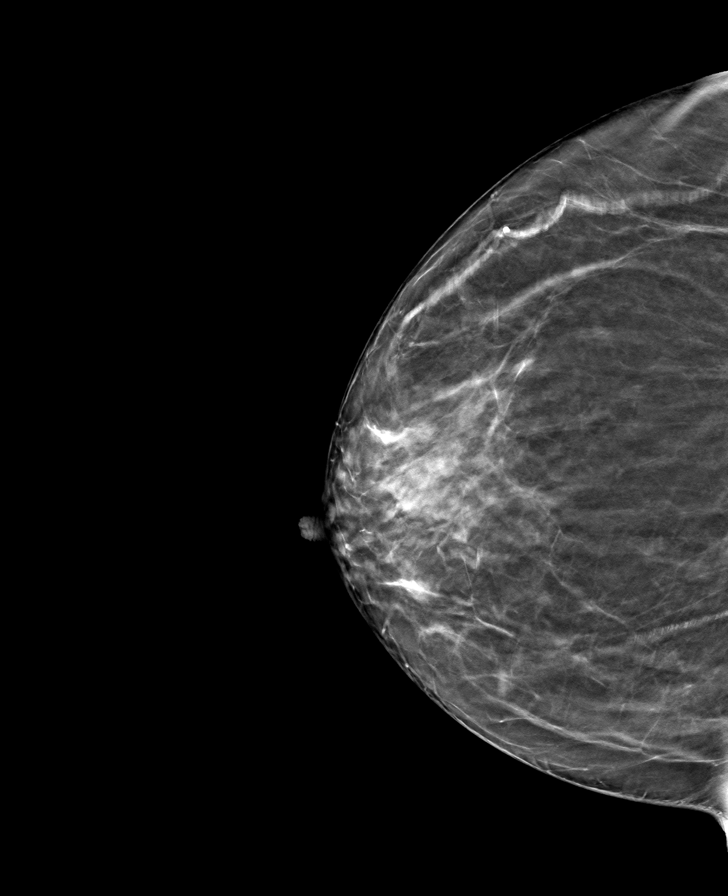

[L MLO tomo · tomo slice 34/67.0]
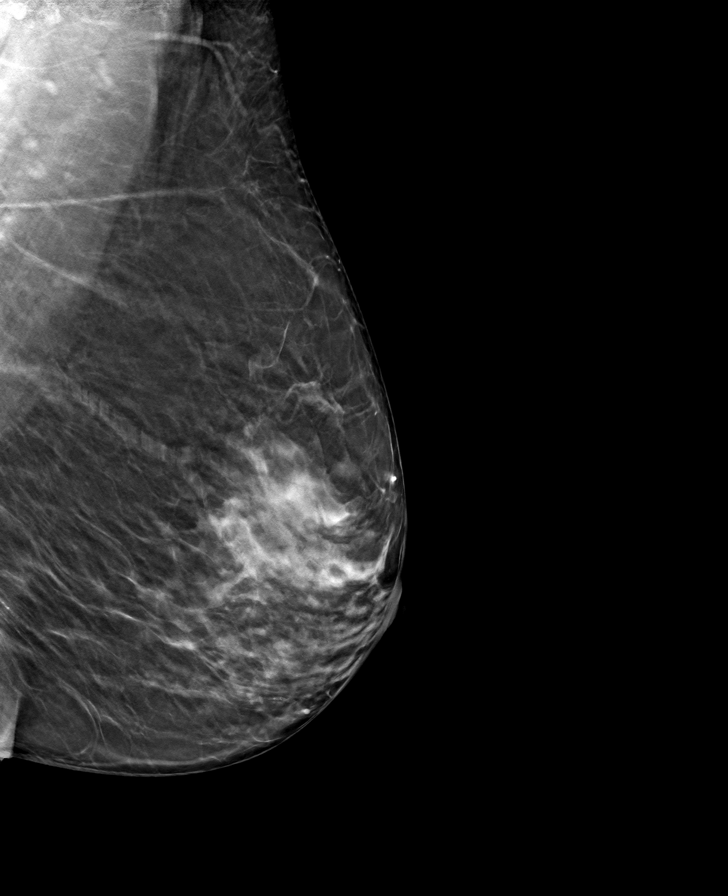

[L CC tomo · tomo slice 34/67.0]
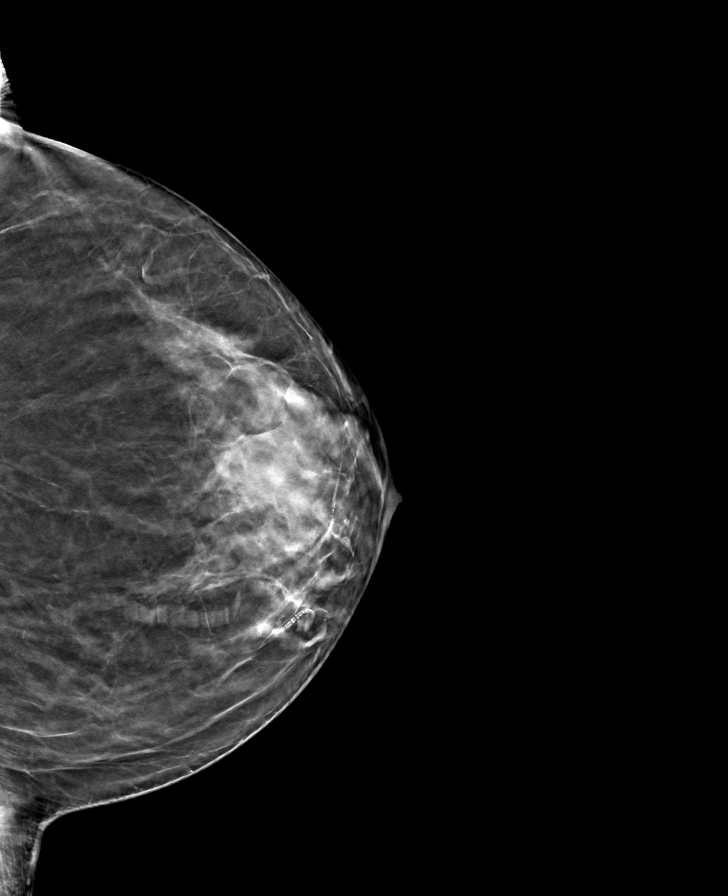

[8 of 24 positions shown; findings below may reference images not displayed]

ACR Breast Density Category b: There are scattered areas of
fibroglandular density.
FINDINGS: There are no findings suspicious for malignancy.
IMPRESSION: No mammographic evidence of malignancy. A result letter of this
screening mammogram will be mailed directly to the patient.

RECOMMENDATION:
Screening mammogram in one year. (Code:51-O-LD2)

BI-RADS CATEGORY  1: Negative.

## 2023-11-22 ENCOUNTER — Ambulatory Visit
Admission: RE | Admit: 2023-11-22 | Discharge: 2023-11-22 | Disposition: A | Discharge status: Home / Self Care | Payer: Medicare HMO | Source: Ambulatory Visit | Attending: Physician Assistant | Admitting: Physician Assistant

## 2023-11-22 ENCOUNTER — Other Ambulatory Visit: Payer: Self-pay | Admitting: Physician Assistant

## 2023-11-22 DIAGNOSIS — M810 Age-related osteoporosis without current pathological fracture: Secondary | ICD-10-CM

## 2023-11-22 DIAGNOSIS — Z0000 Encounter for general adult medical examination without abnormal findings: Secondary | ICD-10-CM

## 2023-12-23 ENCOUNTER — Ambulatory Visit: Payer: Medicare HMO

## 2023-12-25 ENCOUNTER — Ambulatory Visit: Payer: Medicare HMO

## 2023-12-26 ENCOUNTER — Ambulatory Visit
Admission: RE | Admit: 2023-12-26 | Discharge: 2023-12-26 | Disposition: A | Discharge status: Home / Self Care | Source: Ambulatory Visit | Attending: Physician Assistant | Admitting: Physician Assistant

## 2023-12-26 DIAGNOSIS — Z0000 Encounter for general adult medical examination without abnormal findings: Secondary | ICD-10-CM
